# Patient Record
Sex: Female | Born: 1965 | Race: Black or African American | Hispanic: No | Marital: Married | Smoking: Never smoker
Health system: Southern US, Community
[De-identification: ages and names within clinical notes are randomized; demographics above are authoritative.]

## PROBLEM LIST (undated history)

## (undated) DIAGNOSIS — Z6834 Body mass index (BMI) 34.0-34.9, adult: Secondary | ICD-10-CM

## (undated) DIAGNOSIS — E669 Obesity, unspecified: Secondary | ICD-10-CM

## (undated) DIAGNOSIS — R7989 Other specified abnormal findings of blood chemistry: Secondary | ICD-10-CM

## (undated) DIAGNOSIS — R079 Chest pain, unspecified: Secondary | ICD-10-CM

## (undated) DIAGNOSIS — R002 Palpitations: Secondary | ICD-10-CM

## (undated) DIAGNOSIS — E7800 Pure hypercholesterolemia, unspecified: Secondary | ICD-10-CM

## (undated) DIAGNOSIS — R7303 Prediabetes: Secondary | ICD-10-CM

## (undated) HISTORY — DX: Obesity, unspecified: E66.9

## (undated) HISTORY — DX: Other specified abnormal findings of blood chemistry: R79.89

## (undated) HISTORY — DX: Body mass index (BMI) 34.0-34.9, adult: Z68.34

## (undated) HISTORY — DX: Palpitations: R00.2

## (undated) HISTORY — DX: Prediabetes: R73.03

## (undated) HISTORY — DX: Chest pain, unspecified: R07.9

---

## 1997-12-08 ENCOUNTER — Inpatient Hospital Stay (HOSPITAL_COMMUNITY): Admission: AD | Admit: 1997-12-08 | Discharge: 1997-12-11 | Payer: Self-pay | Admitting: Obstetrics and Gynecology

## 1998-01-18 ENCOUNTER — Other Ambulatory Visit: Admission: RE | Admit: 1998-01-18 | Discharge: 1998-01-18 | Payer: Self-pay | Admitting: Obstetrics and Gynecology

## 2000-02-25 ENCOUNTER — Emergency Department (HOSPITAL_COMMUNITY): Admission: EM | Admit: 2000-02-25 | Discharge: 2000-02-25 | Payer: Self-pay | Admitting: Internal Medicine

## 2000-06-02 ENCOUNTER — Encounter: Payer: Self-pay | Admitting: Emergency Medicine

## 2000-06-02 ENCOUNTER — Emergency Department (HOSPITAL_COMMUNITY): Admission: EM | Admit: 2000-06-02 | Discharge: 2000-06-02 | Payer: Self-pay | Admitting: Emergency Medicine

## 2000-06-25 ENCOUNTER — Other Ambulatory Visit: Admission: RE | Admit: 2000-06-25 | Discharge: 2000-06-25 | Payer: Self-pay | Admitting: Obstetrics and Gynecology

## 2000-06-25 ENCOUNTER — Encounter (INDEPENDENT_AMBULATORY_CARE_PROVIDER_SITE_OTHER): Payer: Self-pay

## 2001-01-20 ENCOUNTER — Inpatient Hospital Stay (HOSPITAL_COMMUNITY): Admission: AD | Admit: 2001-01-20 | Discharge: 2001-01-23 | Payer: Self-pay | Admitting: Obstetrics and Gynecology

## 2001-01-20 ENCOUNTER — Encounter (INDEPENDENT_AMBULATORY_CARE_PROVIDER_SITE_OTHER): Payer: Self-pay | Admitting: Specialist

## 2003-04-11 ENCOUNTER — Other Ambulatory Visit: Admission: RE | Admit: 2003-04-11 | Discharge: 2003-04-11 | Payer: Self-pay | Admitting: Obstetrics and Gynecology

## 2005-05-28 ENCOUNTER — Ambulatory Visit (HOSPITAL_COMMUNITY): Admission: RE | Admit: 2005-05-28 | Discharge: 2005-05-28 | Payer: Self-pay | Admitting: Family Medicine

## 2007-08-13 ENCOUNTER — Emergency Department (HOSPITAL_COMMUNITY): Admission: EM | Admit: 2007-08-13 | Discharge: 2007-08-14 | Payer: Self-pay | Admitting: Emergency Medicine

## 2010-09-07 NOTE — Discharge Summary (Signed)
[facility]  Patient:    Leslie Nichols, Leslie Nichols Visit Number: [id number] MRN: [id number]          Service Type: OBS Location: 910A 9129 01 Attending Physician:  Marcelle Overlie Dictated by:   Marcelle Overlie, M.D. Admit Date:  01/20/2001 Discharge Date: 01/23/2001                             Discharge Summary  ADMISSION DIAGNOSES:          1. Intrauterine pregnancy at 39 weeks.                               2. Desires cesarean section and tubal ligation.  DISCHARGE DIAGNOSES:          1. Intrauterine pregnancy at 39 weeks.                               2. Desires cesarean section and tubal ligation.                               3. Status post repeat low transverse cesarean                                  section and tubal ligation.  HOSPITAL COURSE:              The patient is a 45 year old, gravida 3, para 2, with a history of two prior cesarean sections who was admitted for a repeat cesarean section and tubal ligation. The patient underwent repeat low transverse cesarean section and bilateral tubal ligation and she did very well postoperatively without any complications. Her hematocrit on postoperative day #1 was 34.3. The patient was discharged home in good condition on postoperative day #3.  DISCHARGE MEDICATIONS:        She was given a prescription for Advil and Tylox to take as needed for pain.  DISCHARGE INSTRUCTIONS:       She was advised to watch her incision and to call if she has any redness or drainage or increased pain or temperature greater than 100.5. She was instructed no driving for two to three weeks.  DISCHARGE FOLLOWUP:           The patient will follow up in the office in four to six weeks. Dictated by:   Marcelle Overlie, M.D. Attending Physician:  Marcelle Overlie DD:  01/23/01 TD:  01/23/01 Job: [id number]

## 2010-09-07 NOTE — Op Note (Signed)
[facility]  Patient:    Leslie Nichols, Leslie Nichols Visit Number: [id number] MRN: [id number]          Service Type: OBS Location: 910A 9129 01 Attending Physician:  Marcelle Overlie Proc. Date: 01/20/01 Admit Date:  01/20/2001 Discharge Date: 01/23/2001                             Operative Report  PREOPERATIVE DIAGNOSIS:       Intrauterine pregnancy at 39 weeks.  Previous cesarean section x 2, refuses VBAC.  Desires permanent sterilization.  POSTOPERATIVE DIAGNOSIS:      Intrauterine pregnancy at 39 weeks.  Previous cesarean section x 2, refuses VBAC.  Desires permanent sterilization.  OPERATION:                    Repeat low transverse cesarean section and bilateral tubal ligation.  SURGEON:                      Marcelle Overlie, M.D.  ASSISTANT:                    Miguel Aschoff, M.D.  ANESTHESIA:                   Spinal.  ESTIMATED BLOOD LOSS:         300 cc.  FINDINGS:                     A female infant with Apgars of 9 at one minute and 9 at five minutes weighing 7 pounds 13 ounces, loose nuchal cord x 1.  PATHOLOGY:                    Bilateral fallopian tube segments.  DESCRIPTION OF PROCEDURE:     The patient was taken to the operating room where she was given a spinal and placed in the dorsal supine position with a leftward tilt.  The abdomen, vagina, and vulva were prepped and draped in the usual sterile fashion.  Foley catheter was placed in the bladder.  Using a scalpel, a low transverse incision was made at the area of the previous cesarean scar and carried down to the fascia.  The fascia was scored in the midline and extended laterally.  The Pfannenstiel incision was then created, the rectus muscles were separated and peritoneum was entered sharply.  The peritoneal incision was then extended.  The bladder blade was inserted and the lower uterine segment was identified and the bladder flap was created sharply and then digitally.  A low  transverse incision was made in the uterus and extended laterally.  Amniotic fluid was noted to be clear upon entry into the uterine cavity.  The baby was in the cephalic presentation and was a female infant, was delivered without difficulty, and had Apgars of 9 at one minute and 9 at five minutes.  The cord was clamped and cut.  The baby was handed to the awaiting pediatricians.  Cord blood was obtained and the placenta was then manually removed and noted to be intact.  The uterus was cleared of all clots and debris.  The uterine incision was closed in a single layer using 0 chromic in a continuous running locking stitch and was hemostatic.  Attention was then turned to the tubal ligation where a modified Pomeroy method was performed bilaterally.  Both fimbriated ends were visualized  and each tube was grasped in the midportion with a Babcock clamp and a 3 cm knuckle was tied off using plain gut suture x 2.  The knuckle was then excised.  The uterus was then returned to the abdomen.  The peritoneum was closed using 0 Vicryl in continuous running stitch.  The rectus muscles were reapproximated using same 0 Vicryl.  The fascia was closed using 0 Vicryl in continuous running stitch x 2 starting at each corner and meeting in the midline.  After inspection of the cutaneous layer, the skin was closed with staples.  All sponge, needle, and instrument counts were correct x 2.  The patient tolerated the procedure well and went to the recovery room in stable condition. Attending Physician:  Marcelle Overlie DD:  01/23/01 TD:  01/23/01 Job: [id number]

## 2014-05-27 ENCOUNTER — Encounter (HOSPITAL_COMMUNITY): Payer: Self-pay | Admitting: Emergency Medicine

## 2014-05-27 ENCOUNTER — Emergency Department (HOSPITAL_COMMUNITY)
Admission: EM | Admit: 2014-05-27 | Discharge: 2014-05-27 | Disposition: A | Discharge status: Home / Self Care | Payer: No Typology Code available for payment source | Attending: Emergency Medicine | Admitting: Emergency Medicine

## 2014-05-27 DIAGNOSIS — S3992XA Unspecified injury of lower back, initial encounter: Secondary | ICD-10-CM | POA: Insufficient documentation

## 2014-05-27 DIAGNOSIS — M549 Dorsalgia, unspecified: Secondary | ICD-10-CM

## 2014-05-27 DIAGNOSIS — E780 Pure hypercholesterolemia: Secondary | ICD-10-CM | POA: Insufficient documentation

## 2014-05-27 DIAGNOSIS — Z79899 Other long term (current) drug therapy: Secondary | ICD-10-CM | POA: Insufficient documentation

## 2014-05-27 DIAGNOSIS — Y92410 Unspecified street and highway as the place of occurrence of the external cause: Secondary | ICD-10-CM | POA: Diagnosis not present

## 2014-05-27 DIAGNOSIS — S161XXA Strain of muscle, fascia and tendon at neck level, initial encounter: Secondary | ICD-10-CM | POA: Diagnosis not present

## 2014-05-27 DIAGNOSIS — Y998 Other external cause status: Secondary | ICD-10-CM | POA: Diagnosis not present

## 2014-05-27 DIAGNOSIS — Y9389 Activity, other specified: Secondary | ICD-10-CM | POA: Diagnosis not present

## 2014-05-27 DIAGNOSIS — S199XXA Unspecified injury of neck, initial encounter: Secondary | ICD-10-CM | POA: Diagnosis present

## 2014-05-27 HISTORY — DX: Pure hypercholesterolemia, unspecified: E78.00

## 2014-05-27 MED ORDER — HYDROCODONE-ACETAMINOPHEN 5-325 MG PO TABS
2.0000 | ORAL_TABLET | Freq: Once | ORAL | Status: AC
  Administered 2014-05-27: 2 via ORAL
  Filled 2014-05-27: qty 2

## 2014-05-27 MED ORDER — HYDROCODONE-ACETAMINOPHEN 5-325 MG PO TABS: 1.0000 | ORAL_TABLET | Freq: Four times a day (QID) | ORAL | Status: DC | PRN

## 2014-05-27 MED ORDER — CYCLOBENZAPRINE HCL 10 MG PO TABS: 10.0000 mg | ORAL_TABLET | Freq: Two times a day (BID) | ORAL | Status: DC | PRN
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
## 2019-07-28 ENCOUNTER — Other Ambulatory Visit: Payer: Self-pay | Admitting: Family Medicine

## 2019-07-28 DIAGNOSIS — E049 Nontoxic goiter, unspecified: Secondary | ICD-10-CM

## 2019-08-03 ENCOUNTER — Other Ambulatory Visit: Payer: Self-pay | Admitting: Family Medicine

## 2019-08-03 DIAGNOSIS — Z1231 Encounter for screening mammogram for malignant neoplasm of breast: Secondary | ICD-10-CM

## 2019-08-06 ENCOUNTER — Other Ambulatory Visit: Payer: Self-pay

## 2019-08-06 ENCOUNTER — Ambulatory Visit
Admission: RE | Admit: 2019-08-06 | Discharge: 2019-08-06 | Disposition: A | Discharge status: Home / Self Care | Payer: 59 | Source: Ambulatory Visit | Attending: Family Medicine | Admitting: Family Medicine

## 2019-08-06 DIAGNOSIS — Z1231 Encounter for screening mammogram for malignant neoplasm of breast: Secondary | ICD-10-CM

## 2019-09-03 ENCOUNTER — Encounter: Payer: Self-pay | Admitting: Endocrinology

## 2019-09-03 ENCOUNTER — Other Ambulatory Visit (HOSPITAL_COMMUNITY)
Admission: RE | Admit: 2019-09-03 | Discharge: 2019-09-03 | Disposition: A | Discharge status: Home / Self Care | Payer: 59 | Source: Ambulatory Visit | Attending: Endocrinology | Admitting: Endocrinology

## 2019-09-03 ENCOUNTER — Ambulatory Visit (INDEPENDENT_AMBULATORY_CARE_PROVIDER_SITE_OTHER): Payer: 59 | Admitting: Endocrinology

## 2019-09-03 ENCOUNTER — Other Ambulatory Visit: Payer: Self-pay

## 2019-09-03 DIAGNOSIS — E041 Nontoxic single thyroid nodule: Secondary | ICD-10-CM | POA: Insufficient documentation

## 2019-09-03 NOTE — Progress Notes (Signed)
Subjective:    Patient ID: Leslie Nichols, female    DOB: 1965/09/04, 54 y.o.   MRN: [id number]  HPI Pt is referred by Dr Council Mechanic, for nodular thyroid.  Pt was recently noted to have a thyroid nodule.  she is unaware of ever having had thyroid problems in the past.  she has no h/o XRT or surgery to the neck.  She has never had thyroid imaging.  She says the ant neck was painful, but the pain is resolved.  Past Medical History:  Diagnosis Date  . Hypercholesteremia     No past surgical history on file.  Social History   Socioeconomic History  . Marital status: Married    Spouse name: Not on file  . Number of children: Not on file  . Years of education: Not on file  . Highest education level: Not on file  Occupational History  . Not on file  Tobacco Use  . Smoking status: Never Smoker  . Smokeless tobacco: Never Used  Substance and Sexual Activity  . Alcohol use: No  . Drug use: No  . Sexual activity: Not Currently  Other Topics Concern  . Not on file  Social History Narrative  . Not on file   Social Determinants of Health   Financial Resource Strain:   . Difficulty of Paying Living Expenses:   Food Insecurity:   . Worried About Programme researcher, broadcasting/film/video in the Last Year:   . Barista in the Last Year:   Transportation Needs:   . Freight forwarder (Medical):   Marland Kitchen Lack of Transportation (Non-Medical):   Physical Activity:   . Days of Exercise per Week:   . Minutes of Exercise per Session:   Stress:   . Feeling of Stress :   Social Connections:   . Frequency of Communication with Friends and Family:   . Frequency of Social Gatherings with Friends and Family:   . Attends Religious Services:   . Active Member of Clubs or Organizations:   . Attends Banker Meetings:   Marland Kitchen Marital Status:   Intimate Partner Violence:   . Fear of Current or Ex-Partner:   . Emotionally Abused:   Marland Kitchen Physically Abused:   . Sexually Abused:     Current Outpatient  Medications on File Prior to Visit  Medication Sig Dispense Refill  . Calcium Carbonate-Vit D-Min (CALCIUM 1200 PO) Take 1 tablet by mouth daily.    . Omega-3 Fatty Acids (FISH OIL PO) Take 1 capsule by mouth daily.    . simvastatin (ZOCOR) 40 MG tablet Take 40 mg by mouth daily.     No current facility-administered medications on file prior to visit.    No Known Allergies  Family History  Problem Relation Age of Onset  . Thyroid disease Neg Hx     BP 124/80   Pulse 60   Ht 5' 5.75" (1.67 m)   Wt 188 lb (85.3 kg)   SpO2 98%   BMI 30.58 kg/m    Review of Systems Denies weight change, hoarseness, cough, dysphagia, diarrhea, flushing, and cold intolerance.      Objective:   Physical Exam VS: see vs page GEN: no distress HEAD: head: no deformity eyes: no periorbital swelling, no proptosis external nose and ears are normal NECK: 2 cm area of fullness at the right thyroid area.  CHEST WALL: no deformity LUNGS: clear to auscultation CV: reg rate and rhythm, no murmur.  MUSCULOSKELETAL: muscle bulk  and strength are grossly normal.  no obvious joint swelling.  gait is normal and steady EXTEMITIES: no deformity.  no edema PULSES: no carotid bruit NEURO:  cn 2-12 grossly intact.   readily moves all 4's.  sensation is intact to touch on all 4's SKIN:  Normal texture and temperature.  No rash or suspicious lesion is visible.   NODES:  None palpable at the neck PSYCH: alert, well-oriented.  Does not appear anxious nor depressed.  outside test results are reviewed: TSH=0.67  I have reviewed outside records, and summarized: Pt was noted to have thyroid nodule, and referred here.  She was advised to have [place], but she wanted to be seen here first  thyroid needle bx: Location: right thyroid consent obtained, signed form on chart The area is first sprayed with cooling agent local: xylocaine 2%, with epinephrine prep: alcohol pad 4 bxs are done with 25 and 27g needles no  complications     Assessment & Plan:  Thyroid nodule or pseudonodule, new to me. It may have decreased in size since Dr Davene Costain examination last month.    Patient Instructions  Let's check the ultrasound.  you will receive a phone call, about a day and time for an appointment.  We'll let you know about the results of this, and the biopsy.   If as expected, no cancer is found, please come back for a follow-up appointment in 6 months

## 2019-09-03 NOTE — Patient Instructions (Signed)
Let's check the ultrasound.  you will receive a phone call, about a day and time for an appointment.  We'll let you know about the results of this, and the biopsy.   If as expected, no cancer is found, please come back for a follow-up appointment in 6 months

## 2019-09-07 ENCOUNTER — Telehealth: Payer: Self-pay

## 2019-09-07 LAB — CYTOLOGY - NON PAP

## 2019-09-07 NOTE — Telephone Encounter (Signed)
LAB RESULTS  Lab results were reviewed by Dr. Ellison. A letter has been mailed to pt home address. For future reference, letter can be found in Epic. 

## 2019-09-07 NOTE — Telephone Encounter (Signed)
-----   Message from Romero Belling, MD sent at 09/07/2019 12:25 PM EDT ----- please contact patient: Good results.  Please come back for a follow-up appointment in 6 months

## 2019-09-07 NOTE — Progress Notes (Signed)
please contact patient: Good results.  Please come back for a follow-up appointment in 6 months

## 2020-03-17 ENCOUNTER — Ambulatory Visit: Payer: 59 | Admitting: Endocrinology

## 2020-05-19 ENCOUNTER — Other Ambulatory Visit: Payer: Self-pay | Admitting: Family Medicine

## 2020-05-19 DIAGNOSIS — Z1231 Encounter for screening mammogram for malignant neoplasm of breast: Secondary | ICD-10-CM

## 2021-06-01 DIAGNOSIS — H40013 Open angle with borderline findings, low risk, bilateral: Secondary | ICD-10-CM | POA: Diagnosis not present

## 2021-07-10 ENCOUNTER — Telehealth: Payer: Self-pay

## 2021-07-10 NOTE — Telephone Encounter (Signed)
NOTES SCANNED TO REFERRAL 

## 2021-08-05 NOTE — Progress Notes (Signed)
?Cardiology Office Note:   ? ?Date:  08/08/2021  ? ?ID:  Leslie Nichols, DOB 06/29/1965, MRN [id number] ? ?PCP:  Patient, No Pcp Per (Inactive)  ?Cardiologist:  None  ? ?Referring MD: Ward Chatters, FNP  ? ?Chief Complaint  ?Patient presents with  ? Chest Pain  ? ? ?History of Present Illness:   ? ?Leslie Nichols is a 56 y.o. female with a hx of hyperlipidemia, prediabetes, palpitations, and Chest Pain. From [facility]. ? ?Referred for chest pain and palpitations.  Patient states that she did have palpitations several months ago around the time of her brother's death.  That is subsequently completely resolved. ? ?She also complains of a focal mid lower sternal discomfort that comes on after eating.  It is associated with reflux.  No shortness of breath or exertional component.  She denies any swelling. ? ?Past Medical History:  ?Diagnosis Date  ? BMI 34.0-34.9,adult   ? Chest pain   ? Elevated serum creatinine   ? Hypercholesteremia   ? Obesity   ? Palpitations   ? Prediabetes   ? ? ?History reviewed. No pertinent surgical history. ? ?Current Medications: ?Current Meds  ?Medication Sig  ? Ascorbic Acid 500 MG CHEW Chew 1 tablet by mouth daily.  ? Calcium Carbonate-Vit D-Min (CALCIUM 1200 PO) Take 1 tablet by mouth daily.  ? Cholecalciferol 1.25 MG (50000 UT) capsule Take 1 capsule by mouth daily.  ? Omega-3 Fatty Acids (FISH OIL PO) Take 1 capsule by mouth daily.  ? simvastatin (ZOCOR) 40 MG tablet Take 40 mg by mouth daily.  ?  ? ?Allergies:   Patient has no known allergies.  ? ?Social History  ? ?Socioeconomic History  ? Marital status: Married  ?  Spouse name: Not on file  ? Number of children: Not on file  ? Years of education: Not on file  ? Highest education level: Not on file  ?Occupational History  ? Not on file  ?Tobacco Use  ? Smoking status: Never  ? Smokeless tobacco: Never  ?Substance and Sexual Activity  ? Alcohol use: No  ? Drug use: No  ? Sexual activity: Not Currently  ?Other  Topics Concern  ? Not on file  ?Social History Narrative  ? Not on file  ? ?Social Determinants of Health  ? ?Financial Resource Strain: Not on file  ?Food Insecurity: Not on file  ?Transportation Needs: Not on file  ?Physical Activity: Not on file  ?Stress: Not on file  ?Social Connections: Not on file  ?  ? ?Family History: ?The patient's family history is negative for Thyroid disease. ? ?ROS:   ?Please see the history of present illness.    ?No prior history of heart disease.  No family history of heart disease.  All other systems reviewed and are negative. ? ?EKGs/Labs/Other Studies Reviewed:   ? ?The following studies were reviewed today: ?No new data or cardiac imaging ? ?EKG:  EKG sinus bradycardia, normal axis, poor R wave progression V1 through V4.  Relatively low voltage. ? ?Recent Labs: ?No results found for requested labs within last 8760 hours.  ?Recent Lipid Panel ?No results found for: CHOL, TRIG, HDL, CHOLHDL, VLDL, LDLCALC, LDLDIRECT ? ?Physical Exam:   ? ?VS:  BP 128/86   Pulse (!) 51   Ht 5' 5.75" (1.67 m)   Wt 205 lb 3.2 oz (93.1 kg)   SpO2 96%   BMI 33.37 kg/m?    ? ?Wt Readings from Last  3 Encounters:  ?08/08/21 205 lb 3.2 oz (93.1 kg)  ?09/03/19 188 lb (85.3 kg)  ?  ? ?GEN: Overweight.. No acute distress ?HEENT: Normal ?NECK: No JVD. ?LYMPHATICS: No lymphadenopathy ?CARDIAC: No murmur. RRR no gallop, or edema. ?VASCULAR:  Normal Pulses. No bruits. ?RESPIRATORY:  Clear to auscultation without rales, wheezing or rhonchi  ?ABDOMEN: Soft, non-tender, non-distended, No pulsatile mass, ?MUSCULOSKELETAL: No deformity  ?SKIN: Warm and dry ?NEUROLOGIC:  Alert and oriented x 3 ?PSYCHIATRIC:  Normal affect  ? ?ASSESSMENT:   ? ?1. Chest pain of uncertain etiology   ?2. Palpitations   ?3. Prediabetes   ?4. Hyperlipidemia, unspecified hyperlipidemia type   ? ?PLAN:   ? ?In order of problems listed above: ? ?Resolved.  Etiology is uncertain.  No exertional component.  Not a current concern of the  patient's. ?Resolved.  Watchful waiting and will need to have a long-term monitor if it recurs or becomes a problem. ?Risk factor for vascular disease.  Treat all other risk factors as being done for hyperlipidemia. ?Continue statin therapy. ? ? ?For the time being and after discussing with both the patient and her husband, clinical observation is recommended.  No testing ordered at this time. ? ? ?Medication Adjustments/Labs and Tests Ordered: ?Current medicines are reviewed at length with the patient today.  Concerns regarding medicines are outlined above.  ?Orders Placed This Encounter  ?Procedures  ? EKG 12-Lead  ? ?No orders of the defined types were placed in this encounter. ? ? ?Patient Instructions  ?Medication Instructions:  ?Your physician recommends that you continue on your current medications as directed. Please refer to the Current Medication list given to you today. ? ?*If you need a refill on your cardiac medications before your next appointment, please call your pharmacy* ? ? ?Lab Work: ?None ?If you have labs (blood work) drawn today and your tests are completely normal, you will receive your results only by: ?MyChart Message (if you have MyChart) OR ?A paper copy in the mail ?If you have any lab test that is abnormal or we need to change your treatment, we will call you to review the results. ? ? ?Testing/Procedures: ?None ? ? ?Follow-Up: ?At [facility], you and your health needs are our priority.  As part of our continuing mission to provide you with exceptional heart care, we have created designated Provider Care Teams.  These Care Teams include your primary Cardiologist (physician) and Advanced Practice Providers (APPs -  Physician Assistants and Nurse Practitioners) who all work together to provide you with the care you need, when you need it. ? ?We recommend signing up for the patient portal called "MyChart".  Sign up information is provided on this After Visit Summary.  MyChart is used  to connect with patients for Virtual Visits (Telemedicine).  Patients are able to view lab/test results, encounter notes, upcoming appointments, etc.  Non-urgent messages can be sent to your provider as well.   ?To learn more about what you can do with MyChart, go to [email or web address].   ? ?Your next appointment:   ?As needed ? ?The format for your next appointment:   ?In Person ? ?Provider:   ?Elwanda Brooklyn. Leia Alf, MD  ? ? ?Other Instructions ? ? ?Important Information About Sugar ? ? ? ? ?   ? ?Signed, ?Lesleigh Noe, MD  ?08/08/2021 4:17 PM    ?[facility] ?

## 2021-08-08 ENCOUNTER — Ambulatory Visit (INDEPENDENT_AMBULATORY_CARE_PROVIDER_SITE_OTHER): Payer: BC Managed Care – PPO | Admitting: Interventional Cardiology

## 2021-08-08 ENCOUNTER — Encounter: Payer: Self-pay | Admitting: Interventional Cardiology

## 2021-08-08 VITALS — BP 128/86 | HR 51 | Ht 65.75 in | Wt 205.2 lb

## 2021-08-08 DIAGNOSIS — R079 Chest pain, unspecified: Secondary | ICD-10-CM | POA: Diagnosis not present

## 2021-08-08 DIAGNOSIS — E785 Hyperlipidemia, unspecified: Secondary | ICD-10-CM | POA: Diagnosis not present

## 2021-08-08 DIAGNOSIS — R7303 Prediabetes: Secondary | ICD-10-CM | POA: Diagnosis not present

## 2021-08-08 DIAGNOSIS — R002 Palpitations: Secondary | ICD-10-CM | POA: Diagnosis not present

## 2021-08-08 NOTE — Patient Instructions (Signed)
Medication Instructions:  ?Your physician recommends that you continue on your current medications as directed. Please refer to the Current Medication list given to you today. ? ?*If you need a refill on your cardiac medications before your next appointment, please call your pharmacy* ? ? ?Lab Work: ?None ?If you have labs (blood work) drawn today and your tests are completely normal, you will receive your results only by: ?MyChart Message (if you have MyChart) OR ?A paper copy in the mail ?If you have any lab test that is abnormal or we need to change your treatment, we will call you to review the results. ? ? ?Testing/Procedures: ?None ? ? ?Follow-Up: ?At [facility], you and your health needs are our priority.  As part of our continuing mission to provide you with exceptional heart care, we have created designated Provider Care Teams.  These Care Teams include your primary Cardiologist (physician) and Advanced Practice Providers (APPs -  Physician Assistants and Nurse Practitioners) who all work together to provide you with the care you need, when you need it. ? ?We recommend signing up for the patient portal called "MyChart".  Sign up information is provided on this After Visit Summary.  MyChart is used to connect with patients for Virtual Visits (Telemedicine).  Patients are able to view lab/test results, encounter notes, upcoming appointments, etc.  Non-urgent messages can be sent to your provider as well.   ?To learn more about what you can do with MyChart, go to [email or web address].   ? ?Your next appointment:   ?As needed ? ?The format for your next appointment:   ?In Person ? ?Provider:   ?Elwanda Brooklyn. Leia Alf, MD  ? ? ?Other Instructions ? ? ?Important Information About Sugar ? ? ? ? ?  ?

## 2021-10-17 DIAGNOSIS — R1310 Dysphagia, unspecified: Secondary | ICD-10-CM | POA: Diagnosis not present

## 2021-10-17 DIAGNOSIS — K219 Gastro-esophageal reflux disease without esophagitis: Secondary | ICD-10-CM | POA: Diagnosis not present

## 2021-10-24 DIAGNOSIS — K219 Gastro-esophageal reflux disease without esophagitis: Secondary | ICD-10-CM | POA: Diagnosis not present

## 2021-10-24 DIAGNOSIS — K2289 Other specified disease of esophagus: Secondary | ICD-10-CM | POA: Diagnosis not present

## 2021-10-24 DIAGNOSIS — R1310 Dysphagia, unspecified: Secondary | ICD-10-CM | POA: Diagnosis not present

## 2021-10-24 DIAGNOSIS — K2930 Chronic superficial gastritis without bleeding: Secondary | ICD-10-CM | POA: Diagnosis not present

## 2022-03-22 IMAGING — MG DIGITAL SCREENING BILAT W/ TOMO W/ CAD
8 series · 8 of 24 positions shown · non-contrast
Comparison: Previous exam(s).

CLINICAL DATA: Screening.

EXAM:
DIGITAL SCREENING BILATERAL MAMMOGRAM WITH TOMO AND CAD

[R CC synth-2D]
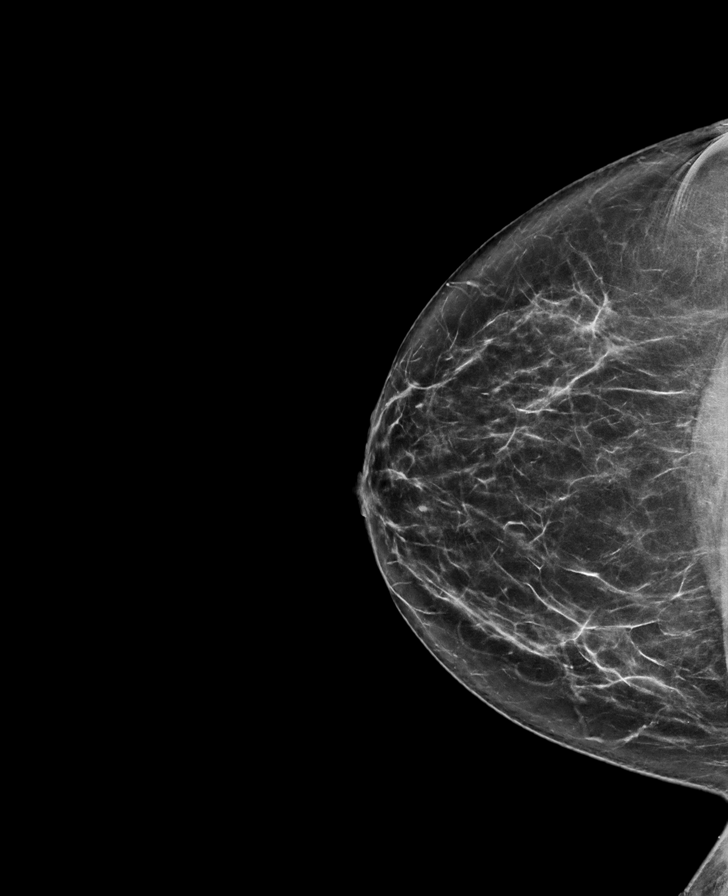

[L CC synth-2D]
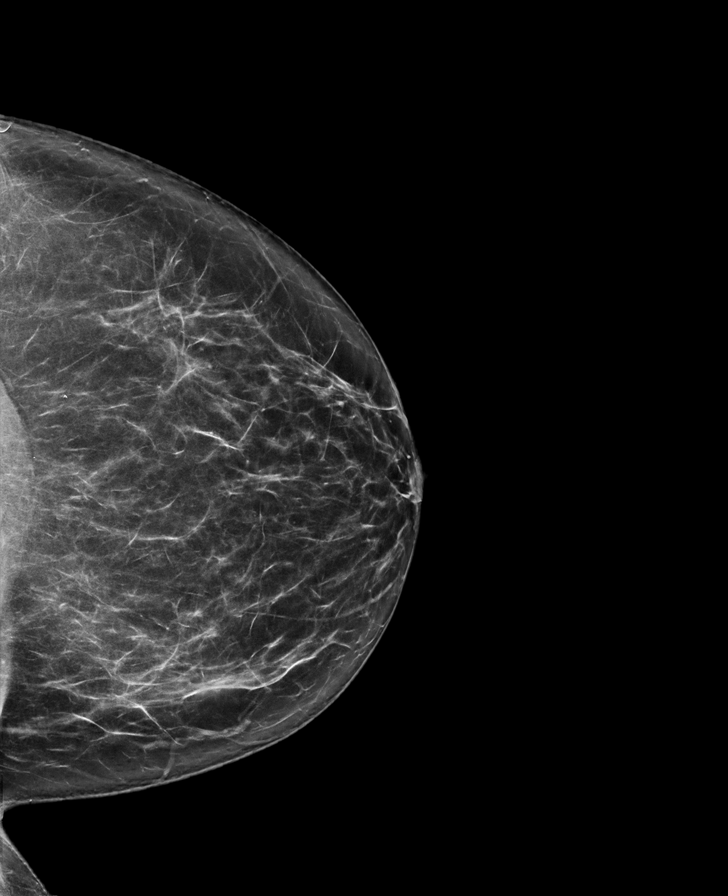

[R MLO synth-2D]
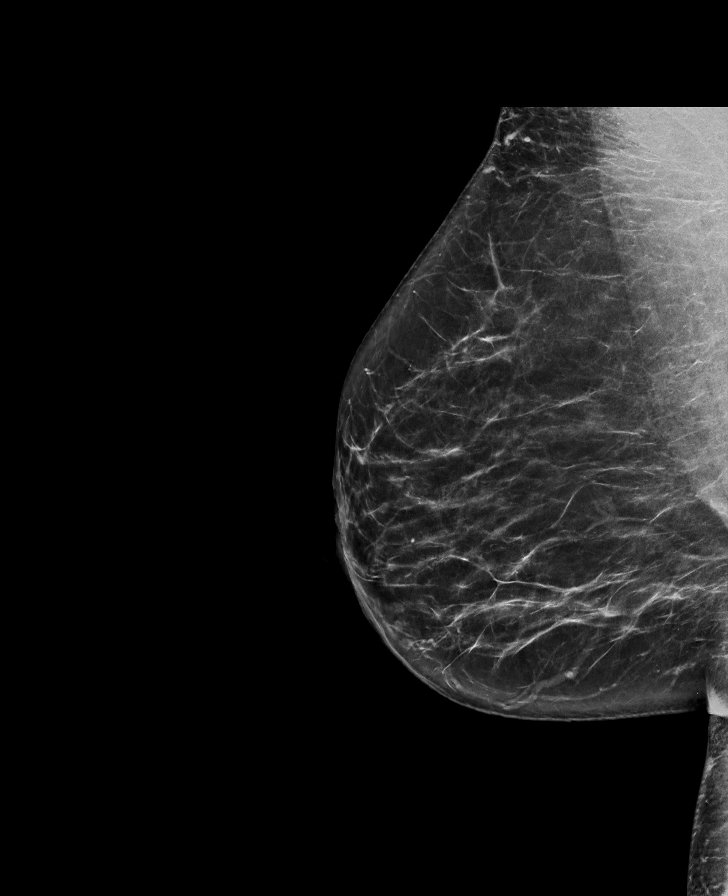

[L MLO synth-2D]
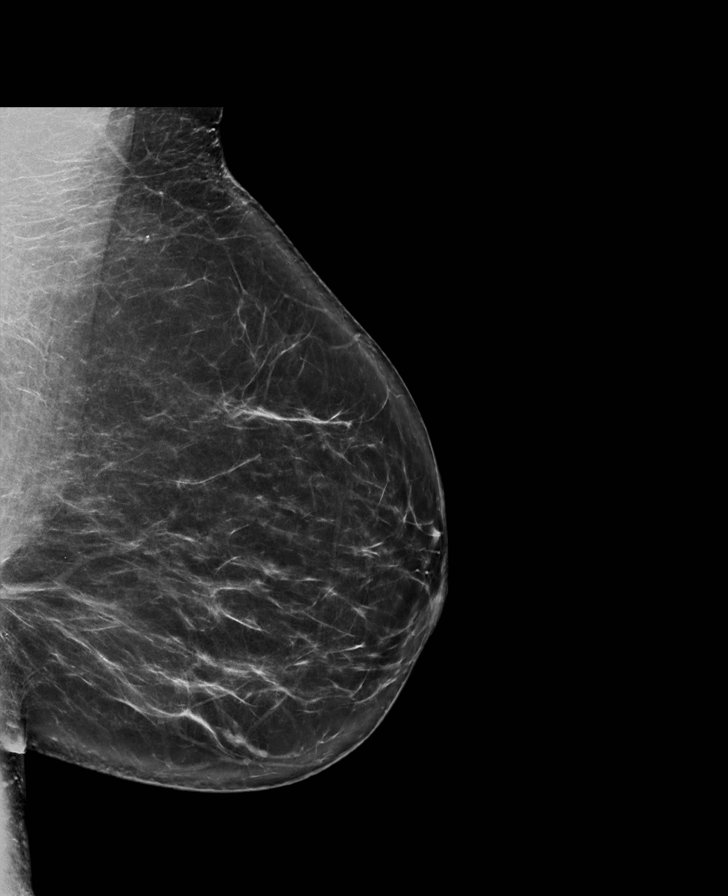

[R MLO tomo · tomo slice 39/78.0]
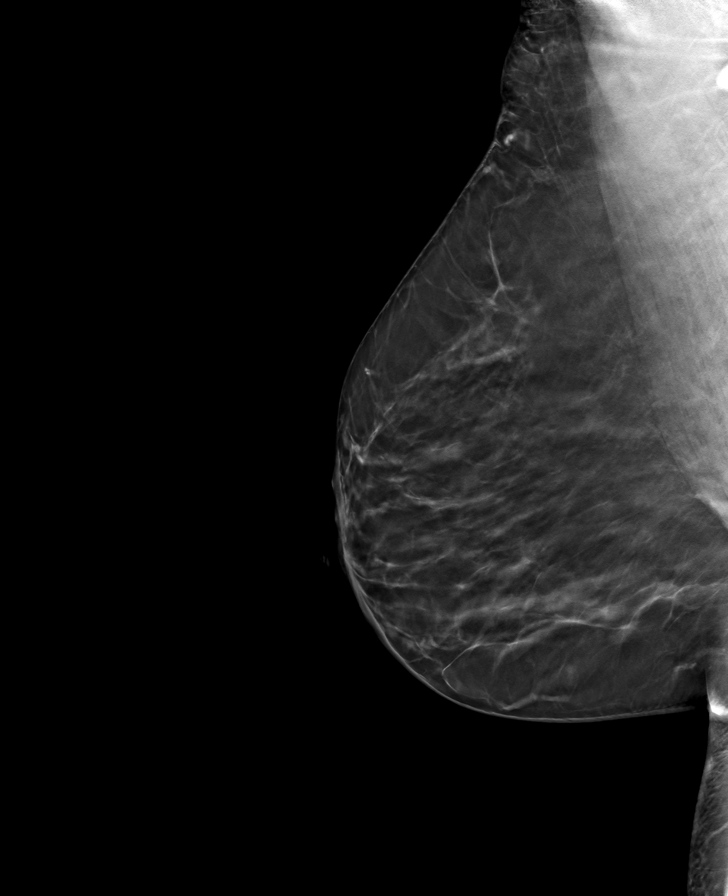

[R CC tomo · tomo slice 37/74.0]
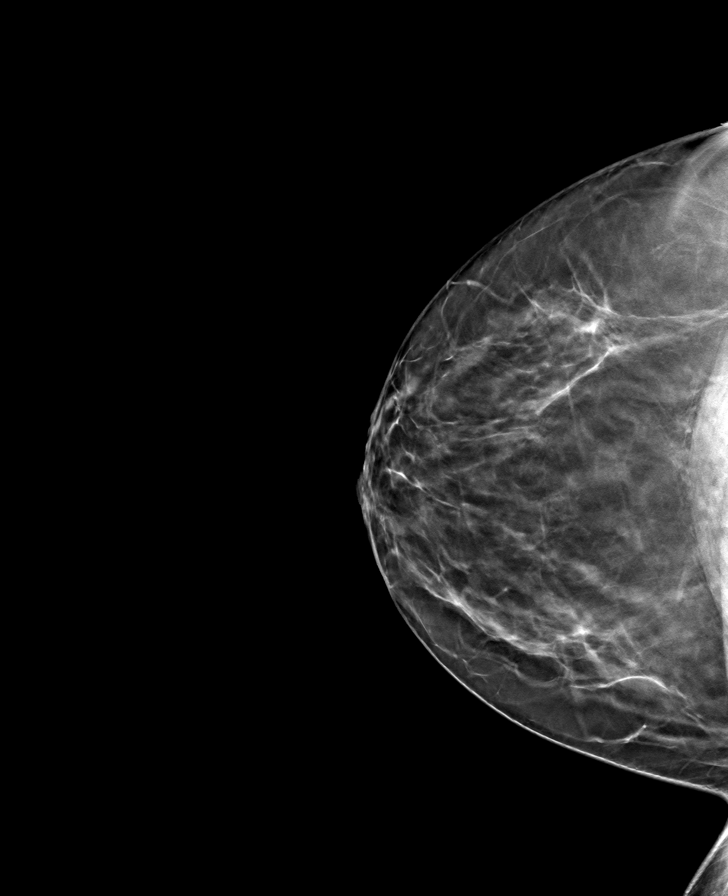

[L MLO tomo · tomo slice 43/86.0]
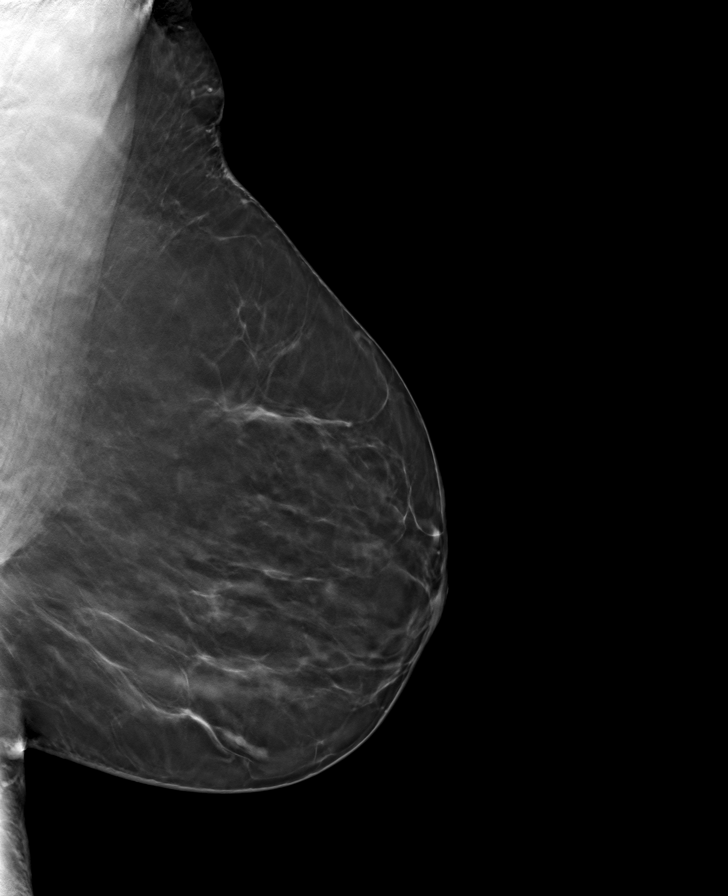

[L CC tomo · tomo slice 38/75.0]
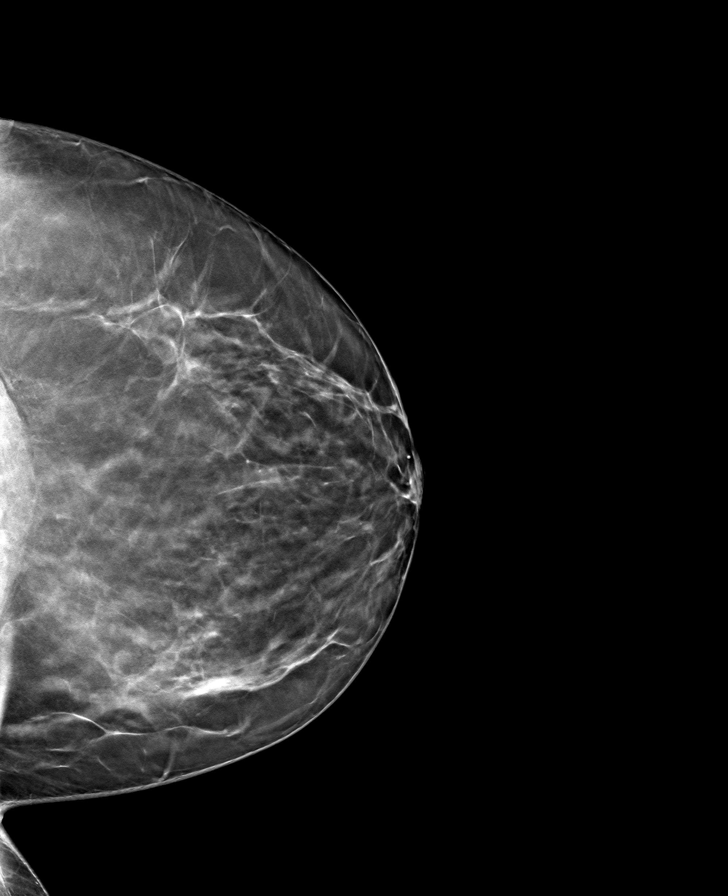

[8 of 24 positions shown; findings below may reference images not displayed]

ACR Breast Density Category b: There are scattered areas of
fibroglandular density.
FINDINGS: There are no findings suspicious for malignancy. Images were
processed with CAD.
IMPRESSION: No mammographic evidence of malignancy. A result letter of this
screening mammogram will be mailed directly to the patient.

RECOMMENDATION:
Screening mammogram in one year. (Code:[id number])

BI-RADS CATEGORY  1: Negative.

## 2022-08-30 ENCOUNTER — Other Ambulatory Visit: Payer: Self-pay | Admitting: Family Medicine

## 2022-08-30 DIAGNOSIS — Z1231 Encounter for screening mammogram for malignant neoplasm of breast: Secondary | ICD-10-CM

## 2022-11-22 ENCOUNTER — Ambulatory Visit: Payer: BC Managed Care – PPO | Admitting: Physician Assistant

## 2022-11-22 ENCOUNTER — Encounter: Payer: Self-pay | Admitting: Physician Assistant

## 2022-11-22 ENCOUNTER — Ambulatory Visit
Admission: RE | Admit: 2022-11-22 | Discharge: 2022-11-22 | Disposition: A | Discharge status: Home / Self Care | Payer: BC Managed Care – PPO | Source: Ambulatory Visit | Attending: Family Medicine | Admitting: Family Medicine

## 2022-11-22 VITALS — BP 126/72 | HR 70 | Ht 65.0 in | Wt 199.0 lb

## 2022-11-22 DIAGNOSIS — K229 Disease of esophagus, unspecified: Secondary | ICD-10-CM

## 2022-11-22 DIAGNOSIS — R1310 Dysphagia, unspecified: Secondary | ICD-10-CM | POA: Diagnosis not present

## 2022-11-22 DIAGNOSIS — K2289 Other specified disease of esophagus: Secondary | ICD-10-CM

## 2022-11-22 DIAGNOSIS — Z1231 Encounter for screening mammogram for malignant neoplasm of breast: Secondary | ICD-10-CM | POA: Diagnosis not present

## 2022-11-22 MED ORDER — AMBULATORY NON FORMULARY MEDICATION: 0 refills | Status: AC

## 2022-11-22 NOTE — Patient Instructions (Signed)
We have sent the following medications to your pharmacy for you to pick up at your convenience: GI cocktail  You have been scheduled for a Barium Esophogram at [facility] (1st floor of the hospital) on 12/06/22 at 2:00pm. Please arrive 15 minutes prior to your appointment for registration. Make certain not to have anything to eat or drink 3 hours prior to your test. If you need to reschedule for any reason, please contact radiology at [phone] to do so. __________________________________________________________________ A barium swallow is an examination that concentrates on views of the esophagus. This tends to be a double contrast exam (barium and two liquids which, when combined, create a gas to distend the wall of the oesophagus) or single contrast (non-ionic iodine based). The study is usually tailored to your symptoms so a good history is essential. Attention is paid during the study to the form, structure and configuration of the esophagus, looking for functional disorders (such as aspiration, dysphagia, achalasia, motility and reflux) EXAMINATION You may be asked to change into a gown, depending on the type of swallow being performed. A radiologist and radiographer will perform the procedure. The radiologist will advise you of the type of contrast selected for your procedure and direct you during the exam. You will be asked to stand, sit or lie in several different positions and to hold a small amount of fluid in your mouth before being asked to swallow while the imaging is performed .In some instances you may be asked to swallow barium coated marshmallows to assess the motility of a solid food bolus. The exam can be recorded as a digital or video fluoroscopy procedure. POST PROCEDURE It will take 1-2 days for the barium to pass through your system. To facilitate this, it is important, unless otherwise directed, to increase your fluids for the next 24-48hrs and to resume your normal  diet.  This test typically takes about 30 minutes to perform. __________________________________________________________________________________   I appreciate the opportunity to care for you. Hyacinth Meeker, PA-C

## 2022-11-22 NOTE — Progress Notes (Signed)
Chief Complaint: Dysphagia  HPI:    Leslie Nichols is a 57 year old female, previously followed with [facility], with a past medical history as listed below, who presents to clinic today to discuss a complaint of dysphagia.    11/2015 colonoscopy with Dr. Elnoria Howard with diverticulosis and otherwise normal.  Repeat recommended in 10 years.    10/24/2021 EGD with [facility] regular, esophageal mucosal changes suggestive of EOE, biopsies negative for EOE and H. pylori, chronic inactive gastritis and normal duodenum.  Told to continue Omeprazole 40 mg daily.    11/14/2021 patient seen in clinic by Cira Servant when she worked for [employer].  That time discussed she was following up.  At that time was because she had reported chest pressure ongoing for 4 to 5 months worse with eating and dysphagia to solids.  EGD as above.    08/19/2022 CBC normal.  CMP normal.  Hemoglobin A1c minimally elevated at 6.1.    08/20/2022 patient seen by PCP and at that time discussed that she was having chest pain when eating or drinking.  Apparently seen by cardiology and a GI doctor and had endoscopy about 8 months ago and was told it was fine.  Though when eating and would swallow felt like it gets stuck and stopped.  Sometimes painful.    Today, patient presents to clinic and tells me that she still has trouble swallowing at times.  This is worse with meats and breads and feels like they get stuck at the bottom of her esophagus and then causes pain.  Tells me this has been occurring ever since last year when her workup was started with [facility].  Tells me they did not find anything.  Never has trouble with liquids.  Does recall being on the Omeprazole 40 mg daily and does not feel like it helped at all.  Tells me she also has times where she just feels like her esophagus hurts and is not sure why.  Does not think the omeprazole helped with that either.      Denies abdominal pain, weight loss, nausea, vomiting or symptoms that  awaken her from sleep.  Past Medical History:  Diagnosis Date   BMI 34.0-34.9,adult    Chest pain    Elevated serum creatinine    Hypercholesteremia    Obesity    Palpitations    Prediabetes     No past surgical history on file.  Current Outpatient Medications  Medication Sig Dispense Refill   Ascorbic Acid 500 MG CHEW Chew 1 tablet by mouth daily.     Calcium Carbonate-Vit D-Min (CALCIUM 1200 PO) Take 1 tablet by mouth daily.     Cholecalciferol 1.25 MG (50000 UT) capsule Take 1 capsule by mouth daily.     Omega-3 Fatty Acids (FISH OIL PO) Take 1 capsule by mouth daily.     simvastatin (ZOCOR) 40 MG tablet Take 40 mg by mouth daily.     No current facility-administered medications for this visit.    Allergies as of 11/22/2022   (No Known Allergies)    Family History  Problem Relation Age of Onset   Thyroid disease Neg Hx     Social History   Socioeconomic History   Marital status: Married    Spouse name: Not on file   Number of children: Not on file   Years of education: Not on file   Highest education level: Not on file  Occupational History   Not on file  Tobacco Use  Smoking status: Never   Smokeless tobacco: Never  Substance and Sexual Activity   Alcohol use: No   Drug use: No   Sexual activity: Not Currently  Other Topics Concern   Not on file  Social History Narrative   Not on file   Social Determinants of Health   Financial Resource Strain: Not on file  Food Insecurity: Not on file  Transportation Needs: Not on file  Physical Activity: Not on file  Stress: Not on file (06/01/2019)  Social Connections: Unknown (09/04/2021)   Received from [facility]   Social Network    Social Network: Not on file  Intimate Partner Violence: Unknown (07/26/2021)   Received from [facility]   HITS    Physically Hurt: Not on file    Insult or Talk Down To: Not on file    Threaten Physical Harm: Not on file    Scream or Curse: Not on file    Review of  Systems:    Constitutional: No weight loss, fever or chills Skin: No rash Cardiovascular: No chest pain   Respiratory: No SOB  Gastrointestinal: See HPI and otherwise negative Genitourinary: No dysuria  Neurological: No headache, dizziness or syncope Musculoskeletal: No new muscle or joint pain Hematologic: No bleeding  Psychiatric: No history of depression or anxiety   Physical Exam:  Vital signs: BP 126/72   Pulse 70   Ht 5\' 5"  (1.651 m)   Wt 199 lb (90.3 kg)   BMI 33.12 kg/m    Constitutional:   Pleasant AA female appears to be in NAD, Well developed, Well nourished, alert and cooperative Head:  Normocephalic and atraumatic. Eyes:   PEERL, EOMI. No icterus. Conjunctiva pink. Ears:  Normal auditory acuity. Neck:  Supple Throat: Oral cavity and pharynx without inflammation, swelling or lesion.  Respiratory: Respirations even and unlabored. Lungs clear to auscultation bilaterally.   No wheezes, crackles, or rhonchi.  Cardiovascular: Normal S1, S2. No MRG. Regular rate and rhythm. No peripheral edema, cyanosis or pallor.  Gastrointestinal:  Soft, nondistended, nontender. No rebound or guarding. Normal bowel sounds. No appreciable masses or hepatomegaly. Rectal:  Not performed.  Msk:  Symmetrical without gross deformities. Without edema, no deformity or joint abnormality.  Neurologic:  Alert and  oriented x4;  grossly normal neurologically.  Skin:   Dry and intact without significant lesions or rashes. Psychiatric: Demonstrates good judgement and reason without abnormal affect or behaviors.  No recent labs.  Assessment: 1.  Dysphagia: EGD in July of last year with chronic inactive gastritis and question of EOE though biopsies did not prove this, no help from Omeprazole 40 mg over the past 6 months, continues with dysphagia occasionally mostly with meats and breads and also occasional esophageal pain with no overt heartburn or reflux; consider dysmotility versus possible EOE  versus other  Plan: 1.  Ordered a barium esophagram with tablet for further evaluation.  Pending results could consider repeat EGD given question of EOE on the first one. 2.  Patient found no benefit from PPI in the past.  Will hold this. 3.  Reviewed antidysphagia measures. 4.  Provided the patient with a GI cocktail 5-10 mL every 4-6 hours as needed for esophageal pain. 5.  Patient assigned to Dr. Chales Abrahams today.  Hyacinth Meeker, PA-C [facility] 11/22/2022, 2:20 PM  Cc: No ref. provider found

## 2022-11-28 NOTE — Progress Notes (Signed)
Agree with assessment/plan.  Raj Lile Mccurley, MD [facility] [phone]  

## 2022-12-06 ENCOUNTER — Ambulatory Visit (HOSPITAL_COMMUNITY)
Admission: RE | Admit: 2022-12-06 | Discharge: 2022-12-06 | Disposition: A | Discharge status: Home / Self Care | Payer: BC Managed Care – PPO | Source: Ambulatory Visit | Attending: Physician Assistant | Admitting: Physician Assistant

## 2022-12-06 DIAGNOSIS — K219 Gastro-esophageal reflux disease without esophagitis: Secondary | ICD-10-CM | POA: Diagnosis not present

## 2022-12-06 DIAGNOSIS — R1310 Dysphagia, unspecified: Secondary | ICD-10-CM | POA: Diagnosis not present

## 2022-12-06 DIAGNOSIS — K2289 Other specified disease of esophagus: Secondary | ICD-10-CM | POA: Diagnosis not present

## 2023-06-04 ENCOUNTER — Ambulatory Visit
Admission: RE | Admit: 2023-06-04 | Discharge: 2023-06-04 | Disposition: A | Discharge status: Home / Self Care | Payer: BC Managed Care – PPO | Source: Ambulatory Visit | Attending: Family Medicine | Admitting: Family Medicine

## 2023-06-04 ENCOUNTER — Other Ambulatory Visit: Payer: Self-pay | Admitting: Family Medicine

## 2023-06-04 DIAGNOSIS — M25551 Pain in right hip: Secondary | ICD-10-CM
